# Patient Record
Sex: Male | Born: 1970 | Race: White | Hispanic: No | Marital: Married | Smoking: Never smoker
Health system: Southern US, Community
[De-identification: ages and names within clinical notes are randomized; demographics above are authoritative.]

## PROBLEM LIST (undated history)

## (undated) ENCOUNTER — Emergency Department (HOSPITAL_COMMUNITY): Payer: BLUE CROSS/BLUE SHIELD | Source: Home / Self Care

## (undated) DIAGNOSIS — C499 Malignant neoplasm of connective and soft tissue, unspecified: Secondary | ICD-10-CM

---

## 2006-08-23 ENCOUNTER — Encounter: Admission: RE | Admit: 2006-08-23 | Discharge: 2006-10-19 | Payer: Self-pay | Admitting: *Deleted

## 2013-09-12 ENCOUNTER — Encounter (INDEPENDENT_AMBULATORY_CARE_PROVIDER_SITE_OTHER): Payer: Self-pay | Admitting: Surgery

## 2013-09-18 ENCOUNTER — Encounter (INDEPENDENT_AMBULATORY_CARE_PROVIDER_SITE_OTHER): Payer: Self-pay | Admitting: Surgery

## 2013-09-18 ENCOUNTER — Ambulatory Visit (INDEPENDENT_AMBULATORY_CARE_PROVIDER_SITE_OTHER): Payer: BC Managed Care – PPO | Admitting: Surgery

## 2013-09-18 VITALS — BP 110/65 | HR 60 | Temp 96.9°F | Resp 12 | Ht 73.5 in | Wt 196.4 lb

## 2013-09-18 DIAGNOSIS — R229 Localized swelling, mass and lump, unspecified: Secondary | ICD-10-CM

## 2013-09-18 DIAGNOSIS — R2232 Localized swelling, mass and lump, left upper limb: Secondary | ICD-10-CM | POA: Insufficient documentation

## 2013-09-18 NOTE — Progress Notes (Signed)
Patient ID: Gene Nguyen, male   DOB: 1970-12-31, 43 y.o.   MRN: [id number]  Chief Complaint  Patient presents with  . Lipoma    HPI Gene Nguyen is a 43 y.o. male.   HPI This gentleman is referred to me by Baruch Goldmann PA for evaluation of a left shoulder mass. The patient reports that the mass is been present for approximately one year it is getting larger. He has a history of lipomas. There is a family history of cancer as well. He reports mild discomfort from the mass but it is getting larger quickly he reports. He has no difficulty moving his arm. He denies recent weight loss or weight gain History reviewed. No pertinent past medical history.  History reviewed. No pertinent past surgical history.  History reviewed. No pertinent family history.  Social History History  Substance Use Topics  . Smoking status: Never Smoker   . Smokeless tobacco: Not on file  . Alcohol Use: Yes     Comment: rare wine    No Known Allergies  No current outpatient prescriptions on file.   No current facility-administered medications for this visit.    Review of Systems Review of Systems  Constitutional: Negative for fever, chills and unexpected weight change.  HENT: Negative for congestion, hearing loss, sore throat, trouble swallowing and voice change.   Eyes: Negative for visual disturbance.  Respiratory: Negative for cough and wheezing.   Cardiovascular: Negative for chest pain, palpitations and leg swelling.  Gastrointestinal: Negative for nausea, vomiting, abdominal pain, diarrhea, constipation, blood in stool, abdominal distention, anal bleeding and rectal pain.  Genitourinary: Negative for hematuria and difficulty urinating.  Musculoskeletal: Negative for arthralgias.  Skin: Negative for rash and wound.  Neurological: Negative for seizures, syncope, weakness and headaches.  Hematological: Negative for adenopathy. Does not bruise/bleed easily.  Psychiatric/Behavioral:  Negative for confusion.    Blood pressure 110/65, pulse 60, temperature 96.9 F (36.1 C), resp. rate 12, height 6' 1.5" (1.867 m), weight 196 lb 6.4 oz (89.086 kg).  Physical Exam Physical Exam  Constitutional: He is oriented to person, place, and time. He appears well-developed and well-nourished. No distress.  HENT:  Head: Normocephalic and atraumatic.  Right Ear: External ear normal.  Left Ear: External ear normal.  Nose: Nose normal.  Mouth/Throat: Oropharynx is clear and moist. No oropharyngeal exudate.  Eyes: Conjunctivae are normal. Pupils are equal, round, and reactive to light. Right eye exhibits no discharge. Left eye exhibits no discharge. No scleral icterus.  Neck: Normal range of motion. Neck supple. No tracheal deviation present.  Cardiovascular: Normal rate, regular rhythm, normal heart sounds and intact distal pulses.   Pulmonary/Chest: Effort normal and breath sounds normal. No respiratory distress. He has no wheezes.  Musculoskeletal: Normal range of motion. He exhibits no edema and no tenderness.  There is a 6 cm x 4 cm mass in the left deltopectoral groove. It is firm but mobile with irregular borders. There is a small scar on the skin overlying the mass. It is not appreciably tender. The skin is slightly darkened around the area  Lymphadenopathy:    He has no cervical adenopathy.    He has no axillary adenopathy.  Neurological: He is alert and oriented to person, place, and time.  Skin: Skin is warm and dry. No erythema.  Psychiatric: His behavior is normal.    Data Reviewed   Assessment    Left shoulder mass     Plan    I am uncertain of the  etiology of this mass. This may represent a complex cyst versus lipoma as he has had lipomas in the past. The firmness, and irregular border is slightly worrisome along with the discoloration of the skin. I would strongly recommend removal of this mass for histologic evaluation to roll malignancy. I discussed the risks  of surgery which includes but is not limited to bleeding, infection, recurrence, need for further surgery, injury to surrounding structures, etc. He understands and wishes to proceed with surgery        Petra Dumler A 09/18/2013, 11:58 AM

## 2013-09-30 ENCOUNTER — Ambulatory Visit (INDEPENDENT_AMBULATORY_CARE_PROVIDER_SITE_OTHER): Payer: BC Managed Care – PPO | Admitting: Internal Medicine

## 2013-09-30 VITALS — BP 100/62 | HR 67 | Temp 98.5°F | Ht 73.5 in | Wt 198.0 lb

## 2013-09-30 DIAGNOSIS — T148XXA Other injury of unspecified body region, initial encounter: Secondary | ICD-10-CM

## 2013-09-30 DIAGNOSIS — R232 Flushing: Secondary | ICD-10-CM

## 2013-09-30 NOTE — Progress Notes (Signed)
   Subjective:    Patient ID: Gene Nguyen, male    DOB: Apr 17, 1970, 43 y.o.   MRN: [id number] This chart was scribed for Gene Lin, MD by Randa Evens, ED Scribe. This Patient was seen in room 11 and the patients care was started at 9:17 PM  HPI Gene Nguyen is a 43 y.o. male He presents to urgent medical with sudden onset of facial redness onset tonight while eating dinner. He states that he didn't take any antihist medication prior to arrival, but that while waiting in the waiting room his symptoms started to improve.  In retrospect, after researching Internet, He states that he believes the onset came from taking niacin, present in several different supplements he started taking today on the advice of the General Nutrition attendant.  He is actually relatively healthy  Review of Systems  Constitutional: Negative for fever and chills.   he complains of pain with pushoff or jumping in his left posterior thigh that has prevented him from being able to play basketball for the last several years. This started as a basketball injury. Objective:    Physical Exam  Nursing note and vitals reviewed. Constitutional: He is oriented to person, place, and time. He appears well-developed and well-nourished. No distress.  HENT:  Head: Normocephalic and atraumatic.  Eyes: Conjunctivae and EOM are normal.  Neck: Neck supple.  Cardiovascular: Normal rate.   Pulmonary/Chest: Effort normal. No respiratory distress.  Musculoskeletal: Normal range of motion.  There is tenderness deep in the left posterior thigh at the issue tuberosity  Neurological: He is alert and oriented to person, place, and time.  Skin: Skin is warm and dry. There is erythema.  Mild erythema of face and neck that appears to be resolving  Psychiatric: He has a normal mood and affect. His behavior is normal.    Assessment & Plan:  1-flushing secondary to niacin----discontinue supplements 2-muscle tear left  thigh--- refer  to physical therapy    I have completed the patient encounter in its entirety as documented by the scribe, with editing by me where necessary. Gene Nguyen, M.D.

## 2013-09-30 NOTE — Patient Instructions (Signed)
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

## 2013-11-04 DIAGNOSIS — C4910 Malignant neoplasm of connective and soft tissue of unspecified upper limb, including shoulder: Secondary | ICD-10-CM

## 2013-11-05 ENCOUNTER — Other Ambulatory Visit (INDEPENDENT_AMBULATORY_CARE_PROVIDER_SITE_OTHER): Payer: Self-pay

## 2013-11-05 MED ORDER — HYDROCODONE-ACETAMINOPHEN 5-325 MG PO TABS: 1.0000 | ORAL_TABLET | ORAL | Status: DC | PRN

## 2013-11-14 ENCOUNTER — Telehealth (INDEPENDENT_AMBULATORY_CARE_PROVIDER_SITE_OTHER): Payer: Self-pay

## 2013-11-14 NOTE — Telephone Encounter (Signed)
Dr. Clover Mealy assistant called back stating the path was sent out for second opinion. Pt advised it may be later next week before pt gets path result.

## 2013-11-14 NOTE — Telephone Encounter (Signed)
Pt called for path result. Per [place] path Dr Donato Heinz still working on path. Pt advised.

## 2013-11-18 ENCOUNTER — Ambulatory Visit (INDEPENDENT_AMBULATORY_CARE_PROVIDER_SITE_OTHER): Payer: BC Managed Care – PPO | Admitting: Surgery

## 2013-11-18 ENCOUNTER — Telehealth (INDEPENDENT_AMBULATORY_CARE_PROVIDER_SITE_OTHER): Payer: Self-pay

## 2013-11-18 VITALS — BP 110/60 | HR 61 | Temp 98.0°F | Ht 73.5 in | Wt 194.0 lb

## 2013-11-18 DIAGNOSIS — C4910 Malignant neoplasm of connective and soft tissue of unspecified upper limb, including shoulder: Secondary | ICD-10-CM

## 2013-11-18 DIAGNOSIS — Z09 Encounter for follow-up examination after completed treatment for conditions other than malignant neoplasm: Secondary | ICD-10-CM

## 2013-11-18 DIAGNOSIS — C4912 Malignant neoplasm of connective and soft tissue of left upper limb, including shoulder: Secondary | ICD-10-CM

## 2013-11-18 NOTE — Telephone Encounter (Signed)
Message copied by Illene Regulus on Mon Nov 18, 2013  3:34 PM ------      Message from: Joya San      Created: Mon Nov 18, 2013  2:33 PM      Contact: [phone]       Pt was going to be referred to [facility] but wife wants [facility] instead. tl ------

## 2013-11-18 NOTE — Addendum Note (Signed)
Addended by: Illene Regulus on: 11/18/2013 11:42 AM   Modules accepted: Orders

## 2013-11-18 NOTE — Telephone Encounter (Signed)
Called pt back to notify him that I would switch the referral order from [facility] to [place]. I did advise that Dr Ninfa Linden does not have a specific doctor to refer pt to as he did for [facility]. I will change the order to [facility] and call pt back wit the appt.

## 2013-11-18 NOTE — Progress Notes (Signed)
Subjective:     Patient ID: Gene Nguyen, male   DOB: 16-Sep-1970, 43 y.o.   MRN: [id number]  HPI He is here for his first postoperative visit status post removal of the large anterior left shoulder mass. He is doing very well has no complaints.  Review of Systems     Objective:   Physical Exam On exam, the incision is healed well. I removed the sutures. There is minimal underlying fluid and no evidence of infection  The pathology had to be sent to [facility] in [place] for a second opinion. 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 This was through a phone report from our pathologist. I do not know about the margins    Assessment:     Patient stable status post removal left shoulder mass     Plan:     I discussed the diagnosis with him. I believe he needs referral to [facility] to the orthopedic oncologist to determine whether further resection is necessary. I will see him back in 3 weeks. Currently, he may return to normal activity.

## 2013-11-19 ENCOUNTER — Other Ambulatory Visit (INDEPENDENT_AMBULATORY_CARE_PROVIDER_SITE_OTHER): Payer: Self-pay | Admitting: *Deleted

## 2013-11-19 DIAGNOSIS — C4911 Malignant neoplasm of connective and soft tissue of right upper limb, including shoulder: Secondary | ICD-10-CM

## 2013-11-19 NOTE — Telephone Encounter (Signed)
Pt called back and was given message.  He will wait to hear from our office with his appointment.

## 2013-11-20 ENCOUNTER — Other Ambulatory Visit (INDEPENDENT_AMBULATORY_CARE_PROVIDER_SITE_OTHER): Payer: Self-pay | Admitting: *Deleted

## 2013-11-20 DIAGNOSIS — C4911 Malignant neoplasm of connective and soft tissue of right upper limb, including shoulder: Secondary | ICD-10-CM

## 2013-11-25 ENCOUNTER — Ambulatory Visit
Admission: RE | Admit: 2013-11-25 | Discharge: 2013-11-25 | Disposition: A | Discharge status: Home / Self Care | Payer: BC Managed Care – PPO | Source: Ambulatory Visit | Attending: Surgery | Admitting: Surgery

## 2013-11-25 DIAGNOSIS — C4911 Malignant neoplasm of connective and soft tissue of right upper limb, including shoulder: Secondary | ICD-10-CM

## 2013-11-25 MED ORDER — GADOBENATE DIMEGLUMINE 529 MG/ML IV SOLN
16.0000 mL | Freq: Once | INTRAVENOUS | Status: AC | PRN
  Administered 2013-11-25: 16 mL via INTRAVENOUS

## 2014-07-10 ENCOUNTER — Encounter (HOSPITAL_COMMUNITY): Payer: Self-pay

## 2014-07-10 ENCOUNTER — Emergency Department (HOSPITAL_COMMUNITY)
Admission: EM | Admit: 2014-07-10 | Discharge: 2014-07-10 | Disposition: A | Discharge status: Home / Self Care | Payer: BLUE CROSS/BLUE SHIELD | Attending: Emergency Medicine | Admitting: Emergency Medicine

## 2014-07-10 DIAGNOSIS — Z79899 Other long term (current) drug therapy: Secondary | ICD-10-CM | POA: Diagnosis not present

## 2014-07-10 DIAGNOSIS — J029 Acute pharyngitis, unspecified: Secondary | ICD-10-CM | POA: Diagnosis not present

## 2014-07-10 DIAGNOSIS — R0981 Nasal congestion: Secondary | ICD-10-CM

## 2014-07-10 LAB — RAPID STREP SCREEN (MED CTR MEBANE ONLY): Streptococcus, Group A Screen (Direct): NEGATIVE

## 2014-07-10 MED ORDER — LORATADINE 10 MG PO TABS: 10.0000 mg | ORAL_TABLET | Freq: Every day | ORAL | Status: DC

## 2014-07-10 NOTE — ED Notes (Addendum)
Patient reports that he has had a sore throat for nearly 3 weeks.  Productive cough.  He reports his wife and child have had strep throat recently.  States, "I think I have bronchitis or walking pneumonia."

## 2014-07-10 NOTE — ED Provider Notes (Signed)
CSN: [id number]     Arrival date & time 07/10/14  0433 History   First MD Initiated Contact with Patient 07/10/14 [phone]     Chief Complaint  Patient presents with  . Sore Throat     (Consider location/radiation/quality/duration/timing/severity/associated sxs/prior Treatment) Patient is a 44 y.o. male presenting with pharyngitis. The history is provided by the patient.  Sore Throat This is a new problem. The current episode started 1 to 4 weeks ago. The problem occurs constantly. The problem has been unchanged. Associated symptoms include a rash and a sore throat. Pertinent negatives include no chills, congestion, coughing, fever, myalgias, nausea, neck pain or weakness. The symptoms are aggravated by swallowing. He has tried nothing for the symptoms. The treatment provided no relief.    History reviewed. No pertinent past medical history. History reviewed. No pertinent past surgical history. Family History  Problem Relation Age of Onset  . Cancer Other    History  Substance Use Topics  . Smoking status: Never Smoker   . Smokeless tobacco: Not on file  . Alcohol Use: Yes     Comment: rare wine    Review of Systems  Constitutional: Negative for fever and chills.  HENT: Positive for postnasal drip, rhinorrhea and sore throat. Negative for congestion.   Respiratory: Negative for cough and shortness of breath.   Gastrointestinal: Negative for nausea.  Musculoskeletal: Negative for myalgias and neck pain.  Skin: Positive for rash.  Neurological: Negative for weakness.      Allergies  Review of patient's allergies indicates no known allergies.  Home Medications   Prior to Admission medications   Medication Sig Start Date End Date Taking? Authorizing Provider  OVER THE COUNTER MEDICATION Take 1 capsule by mouth daily.   Yes Historical Provider, MD  loratadine (CLARITIN) 10 MG tablet Take 1 tablet (10 mg total) by mouth daily. 07/10/14   Junius Creamer, NP   BP 121/71 mmHg  Pulse  80  Temp(Src) 98.3 F (36.8 C) (Oral)  Resp 20  SpO2 99% Physical Exam  Constitutional: He is oriented to person, place, and time. He appears well-developed and well-nourished.  HENT:  Head: Normocephalic.  Right Ear: External ear normal.  Left Ear: External ear normal.  Mouth/Throat: Uvula is midline. No trismus in the jaw. No uvula swelling. Posterior oropharyngeal erythema present. No oropharyngeal exudate or posterior oropharyngeal edema.  Posterior pharynx is reddened, not edematous.  No exudate  Eyes: Pupils are equal, round, and reactive to light.  Neck: Normal range of motion.  Cardiovascular: Normal rate and regular rhythm.   Pulmonary/Chest: Effort normal and breath sounds normal.  Musculoskeletal: Normal range of motion.  Lymphadenopathy:    He has no cervical adenopathy.  Neurological: He is alert and oriented to person, place, and time.  Skin: Skin is warm and dry.    ED Course  Procedures (including critical care time) Labs Review Labs Reviewed  RAPID STREP SCREEN  CULTURE, GROUP A STREP    Imaging Review No results found.   EKG Interpretation None      MDM   Final diagnoses:  Pharyngitis  Nasal congestion         Junius Creamer, NP 07/10/14 0559  Linton Flemings, MD 07/10/14 [id number]

## 2014-07-10 NOTE — Discharge Instructions (Signed)
Your strep test is negative.  BUS prescription for Claritin.  Please take this on a regular basis.  I think most of your symptoms are due to seasonal allergies

## 2014-07-12 LAB — CULTURE, GROUP A STREP: Strep A Culture: NEGATIVE

## 2014-07-13 ENCOUNTER — Encounter (HOSPITAL_COMMUNITY): Payer: Self-pay | Admitting: Emergency Medicine

## 2014-07-13 ENCOUNTER — Emergency Department (HOSPITAL_COMMUNITY): Payer: BLUE CROSS/BLUE SHIELD

## 2014-07-13 ENCOUNTER — Emergency Department (HOSPITAL_COMMUNITY)
Admission: EM | Admit: 2014-07-13 | Discharge: 2014-07-13 | Disposition: A | Discharge status: Home / Self Care | Payer: BLUE CROSS/BLUE SHIELD | Attending: Emergency Medicine | Admitting: Emergency Medicine

## 2014-07-13 DIAGNOSIS — Z85830 Personal history of malignant neoplasm of bone: Secondary | ICD-10-CM | POA: Diagnosis not present

## 2014-07-13 DIAGNOSIS — Y9389 Activity, other specified: Secondary | ICD-10-CM | POA: Diagnosis not present

## 2014-07-13 DIAGNOSIS — S8991XA Unspecified injury of right lower leg, initial encounter: Secondary | ICD-10-CM | POA: Diagnosis present

## 2014-07-13 DIAGNOSIS — Y9289 Other specified places as the place of occurrence of the external cause: Secondary | ICD-10-CM | POA: Diagnosis not present

## 2014-07-13 DIAGNOSIS — X58XXXA Exposure to other specified factors, initial encounter: Secondary | ICD-10-CM | POA: Insufficient documentation

## 2014-07-13 DIAGNOSIS — S76101A Unspecified injury of right quadriceps muscle, fascia and tendon, initial encounter: Secondary | ICD-10-CM | POA: Insufficient documentation

## 2014-07-13 DIAGNOSIS — M25561 Pain in right knee: Secondary | ICD-10-CM

## 2014-07-13 DIAGNOSIS — Y998 Other external cause status: Secondary | ICD-10-CM | POA: Insufficient documentation

## 2014-07-13 DIAGNOSIS — Z79899 Other long term (current) drug therapy: Secondary | ICD-10-CM | POA: Diagnosis not present

## 2014-07-13 HISTORY — DX: Malignant neoplasm of connective and soft tissue, unspecified: C49.9

## 2014-07-13 MED ORDER — KETOROLAC TROMETHAMINE 60 MG/2ML IM SOLN
60.0000 mg | Freq: Once | INTRAMUSCULAR | Status: AC
  Administered 2014-07-13: 60 mg via INTRAMUSCULAR
  Filled 2014-07-13: qty 2

## 2014-07-13 NOTE — Discharge Instructions (Signed)
Return to the emergency room with worsening of symptoms, new symptoms or with symptoms that are concerning, especially fevers, redness, swelling, numbness, tingling, weakness. RICE: Rest, Ice (three cycles of 20 mins on, 87mins off at least twice a day), compression/brace, elevation. Heating pad works well for back pain. Ibuprofen 400mg  (2 tablets 200mg ) every 5-6 hours for 3-5 days. Keep right knee immobilizer on at all times. Do not bear weight on right foot until evaluated by orthopedics. Call the number above as soon as possible to schedule an appointment. Read below information and follow recommendations. Quadriceps Tendon Tear/Disruption with Rehab The quadriceps muscles are located on the front of the thigh and are responsible for straightening the knee and bending the hip. The quadriceps tendon connects these muscles to the kneecap (patella) and also from the patella to a portion of the shin bone (tibial tubercle). A quadriceps tendon tear or disruption is characterized by a partial or complete tear of the quadriceps tendon between the quadriceps muscles and the patella. Quadriceps tendon tears or disruptions often cause pain above the knee and result in a decrease in function of the quadriceps muscles.  SYMPTOMS   A "pop" or tear felt or heard above the patella at the time of injury.  Pain, tenderness, inflammation, and/or bruising over the quadriceps tendon.  Pain that worsens with use of the quadriceps muscles.  Difficulty with common tasks that involve the quadriceps muscle, such as walking.  A crackling sound (crepitation) when the tendon is moved or touched.  Loss of fullness of the muscle or bulging within the area of muscle with complete rupture. CAUSES  A strain occurs when a force is placed on the muscle or tendon that is greater than it can withstand. Common mechanisms of injury include:  Repetitive strenuous use of the quadriceps muscles. This may be due to an increase in  the intensity, frequency, or duration of exercise.  Direct trauma to the quadriceps muscles or tendons. RISK INCREASES WITH:  Activities that involve forceful contractions of the quadriceps muscles (jumping or sprinting).  Contact sports (soccer or football).  Poor strength and flexibility.  Failure to warm-up properly before activity.  Previous injury to the thigh or knee.  Untreated quadriceps tendinitis.  Corticosteroid injections into the quadriceps tendon. PREVENTION  Warm up and stretch properly before activity.  Allow for adequate recovery between workouts.  Maintain physical fitness:  Strength, flexibility, and endurance.  Cardiovascular fitness.  Wear properly fitted and padded protective equipment. PROGNOSIS  If treated properly, then recovery from a quadriceps tendon tear or disruption usually occurs; however the recovery period may b 6 to 9 months.  RELATED COMPLICATIONS   Quadriceps muscle weakness.  Re-rupture of the tendon after treatment.  Prolonged healing time, if improperly treated or re-injured.  Risks of surgery: infection, bleeding, nerve damage, or damage to surrounding tissues. TREATMENT  Initial treatment involves rest from any activities that aggravate the symptoms. Ice, medication, and elevation may be used to help reduce pain and inflammation. The use of strengthening and stretching exercises may help reduce pain with activity. These exercises may be performed at home or with referral to a therapist. If the tear is complete, then surgery is usually required to repair the tendon, as it cannot heal on its own. After surgery immobilization is required to allow for healing. After immobilization it is important to perform strengthening and stretching exercises to help regain strength and a full range of motion.  MEDICATION   If pain medication is necessary,  then nonsteroidal anti-inflammatory medications, such as aspirin and ibuprofen, or other  minor pain relievers, such as acetaminophen, are often recommended.  Do not take pain medication for 7 days before surgery.  Prescription pain relievers may be given if deemed necessary by your caregiver. Use only as directed and only as much as you need. COLD THERAPY  Cold treatment (icing) relieves pain and reduces inflammation. Cold treatment should be applied for 10 to 15 minutes every 2 to 3 hours for inflammation and pain and immediately after any activity that aggravates your symptoms. Use ice packs or massage the area with a piece of ice (ice massage). SEEK MEDICAL CARE IF:  Treatment seems to offer no benefit, or the condition worsens.  Any medications produce adverse side effects.  Any complications from surgery occur:  Pain, numbness, or coldness in the extremity operated upon.  Discoloration of the nail beds (they become blue or gray) of the extremity operated upon.  Signs of infections (fever, pain, inflammation, redness, or persistent bleeding). EXERCISES  Document Released: 03/21/2005 Document Revised: 06/13/2011 Document Reviewed: 07/03/2008 [facility] Patient Information 2015 [employer], [place]. This information is not intended to replace advice given to you by your health care provider. Make sure you discuss any questions you have with your health care provider.

## 2014-07-13 NOTE — ED Provider Notes (Signed)
CSN: [id number]     Arrival date & time 07/13/14  49 History   First MD Initiated Contact with Patient 07/13/14 1113     Chief Complaint  Patient presents with  . Knee Injury     (Consider location/radiation/quality/duration/timing/severity/associated sxs/prior Treatment) HPI  Gene Nguyen is a 44 y.o. male with PMH of sarcoma presenting with right knee pain and reported swelling for 1 week after more arts training. Patient reports continuing to train on it with persistent pain and worsening swelling. Patient has used ibuprofen and ice with some relief but states he is unable to bear weight on it and is ambulating with a cane. Patient denies alleviating factors. No fevers chills no numbness, dealing, weakness.   Past Medical History  Diagnosis Date  . Sarcoma    History reviewed. No pertinent past surgical history. Family History  Problem Relation Age of Onset  . Cancer Other    History  Substance Use Topics  . Smoking status: Never Smoker   . Smokeless tobacco: Not on file  . Alcohol Use: Yes     Comment: rare wine    Review of Systems  Musculoskeletal: Positive for myalgias. Negative for joint swelling.  Skin: Negative for color change and wound.  Neurological: Negative for weakness and numbness.      Allergies  Review of patient's allergies indicates no known allergies.  Home Medications   Prior to Admission medications   Medication Sig Start Date End Date Taking? Authorizing Provider  loratadine (CLARITIN) 10 MG tablet Take 1 tablet (10 mg total) by mouth daily. 07/10/14   Junius Creamer, NP  OVER THE COUNTER MEDICATION Take 1 capsule by mouth daily.    Historical Provider, MD   BP 118/67 mmHg  Pulse 87  Temp(Src) 99.1 F (37.3 C) (Oral)  Resp 20  SpO2 100% Physical Exam  Constitutional: He appears well-developed and well-nourished. No distress.  HENT:  Head: Normocephalic and atraumatic.  Eyes: Conjunctivae are normal. Right eye exhibits no  discharge. Left eye exhibits no discharge.  Pulmonary/Chest: Effort normal. No respiratory distress.  Musculoskeletal:  Right knee: No effusion, erythema or warmth. No tenderness over medial joint line. Mild tenderness over lateral joint line. Tenderness and muscle tightness behind medial no bursa. Full range of motion of right knee without crepitus. No ligamentous laxity noted. Negative anterior and posterior drawer test. Patient with antalgic gait with cane. No tenderness to lower leg or thigh. No lesions, ecchymoses or wounds to thigh or leg. No swelling of lower extremities. No calf Tenderness. 2+ pedal pulses equal bilaterally.  Intact strength and sensation. Patient with low riding patella on right. Quadriceps tendon appears to be partially intact.   Neurological: He is alert. Coordination normal.  Skin: He is not diaphoretic.  Psychiatric: He has a normal mood and affect. His behavior is normal.  Nursing note and vitals reviewed.   ED Course  Procedures (including critical care time) Labs Review Labs Reviewed - No data to display  Imaging Review Dg Knee Complete 4 Views Right  07/13/2014   CLINICAL DATA:  Trauma to right knee six days ago with worsening pain and trouble bearing weight. Pain posteriorly.  EXAM: RIGHT KNEE - COMPLETE 4+ VIEW  COMPARISON:  None.  FINDINGS: Exam demonstrates no evidence of fracture or dislocation. Suggestion of a small joint effusion. Slight patella baja as cannot exclude quadriceps tendon injury.  IMPRESSION: No acute fracture. Possible small joint effusion. Slight patella baja as cannot exclude a quadriceps tendon injury.  Electronically Signed   By: Marin Olp M.D.   On: 07/13/2014 12:56     EKG Interpretation None      MDM   Final diagnoses:  Unsp injury of right quadriceps musc/fasc/tend, init  Knee pain, right   Patient with patella baja with possible quadriceps tendon injury. Neurovascular intact. Full range of motion. Septic  arthritis. Patient's pain managed in the ED. Discussed further ibuprofen and rice use. Patient given knee immobilizer and instructed nonweightbearing status. Patient to follow-up with orthopedics  Discussed return precautions with patient. Discussed all results and patient verbalizes understanding and agrees with plan.  I personally performed the services described in this documentation, which was scribed in my presence. The recorded information has been reviewed and is accurate.   Al Corpus, PA-C 07/13/14 [place], MD 07/13/14 1438

## 2014-07-13 NOTE — ED Notes (Signed)
Pt c/o R knee pain and swelling since Monday night after martial arts training. Pt. sts he continued to train on it and the pain and swelling has gotten worse. Pt sts he is unable to bear weight on it without a cane. Pt has been taking ibuprofen without relief. A&Ox4.

## 2014-07-13 NOTE — ED Notes (Signed)
Ortho at bedside.

## 2014-07-18 ENCOUNTER — Other Ambulatory Visit: Payer: Self-pay | Admitting: Orthopaedic Surgery

## 2014-07-18 DIAGNOSIS — M25562 Pain in left knee: Secondary | ICD-10-CM

## 2014-07-21 ENCOUNTER — Ambulatory Visit
Admission: RE | Admit: 2014-07-21 | Discharge: 2014-07-21 | Disposition: A | Discharge status: Home / Self Care | Payer: BLUE CROSS/BLUE SHIELD | Source: Ambulatory Visit | Attending: Orthopaedic Surgery | Admitting: Orthopaedic Surgery

## 2014-07-21 DIAGNOSIS — M25562 Pain in left knee: Secondary | ICD-10-CM

## 2014-08-06 ENCOUNTER — Ambulatory Visit (INDEPENDENT_AMBULATORY_CARE_PROVIDER_SITE_OTHER): Payer: BLUE CROSS/BLUE SHIELD | Admitting: Urgent Care

## 2014-08-06 ENCOUNTER — Other Ambulatory Visit: Payer: Self-pay | Admitting: Urgent Care

## 2014-08-06 ENCOUNTER — Ambulatory Visit (INDEPENDENT_AMBULATORY_CARE_PROVIDER_SITE_OTHER): Payer: BLUE CROSS/BLUE SHIELD

## 2014-08-06 VITALS — BP 128/82 | HR 73 | Temp 98.4°F | Resp 16 | Ht 73.5 in | Wt 178.0 lb

## 2014-08-06 DIAGNOSIS — M25562 Pain in left knee: Secondary | ICD-10-CM

## 2014-08-06 DIAGNOSIS — M25462 Effusion, left knee: Secondary | ICD-10-CM

## 2014-08-06 NOTE — Patient Instructions (Addendum)
Knee Effusion The medical term for having fluid in your knee is effusion. This is often due to an internal derangement of the knee. This means something is wrong inside the knee. Some of the causes of fluid in the knee may be torn cartilage, a torn ligament, or bleeding into the joint from an injury. Your knee is likely more difficult to bend and move. This is often because there is increased pain and pressure in the joint. The time it takes for recovery from a knee effusion depends on different factors, including:   Type of injury.  Your age.  Physical and medical conditions.  Rehabilitation Strategies. How long you will be away from your normal activities will depend on what kind of knee problem you have and how much damage is present. Your knee has two types of cartilage. Articular cartilage covers the bone ends and lets your knee bend and move smoothly. Two menisci, thick pads of cartilage that form a rim inside the joint, help absorb shock and stabilize your knee. Ligaments bind the bones together and support your knee joint. Muscles move the joint, help support your knee, and take stress off the joint itself. CAUSES  Often an effusion in the knee is caused by an injury to one of the menisci. This is often a tear in the cartilage. Recovery after a meniscus injury depends on how much meniscus is damaged and whether you have damaged other knee tissue. Small tears may heal on their own with conservative treatment. Conservative means rest, limited weight bearing activity and muscle strengthening exercises. Your recovery may take up to 6 weeks.  TREATMENT  Larger tears may require surgery. Meniscus injuries may be treated during arthroscopy. Arthroscopy is a procedure in which your surgeon uses a small telescope like instrument to look in your knee. Your caregiver can make a more accurate diagnosis (learning what is wrong) by performing an arthroscopic procedure. If your injury is on the inner margin  of the meniscus, your surgeon may trim the meniscus back to a smooth rim. In other cases your surgeon will try to repair a damaged meniscus with stitches (sutures). This may make rehabilitation take longer, but may provide better long term result by helping your knee keep its shock absorption capabilities. Ligaments which are completely torn usually require surgery for repair. HOME CARE INSTRUCTIONS  Use crutches as instructed.  If a brace is applied, use as directed.  Once you are home, an ice pack applied to your swollen knee may help with discomfort and help decrease swelling.  Keep your knee raised (elevated) when you are not up and around or on crutches.  Only take over-the-counter or prescription medicines for pain, discomfort, or fever as directed by your caregiver.  Your caregivers will help with instructions for rehabilitation of your knee. This often includes strengthening exercises.  You may resume a normal diet and activities as directed. SEEK MEDICAL CARE IF:   There is increased swelling in your knee.  You notice redness, swelling, or increasing pain in your knee.  An unexplained oral temperature above 102 F (38.9 C) develops. SEEK IMMEDIATE MEDICAL CARE IF:   You develop a rash.  You have difficulty breathing.  You have any allergic reactions from medications you may have been given.  There is severe pain with any motion of the knee. MAKE SURE YOU:   Understand these instructions.  Will watch your condition.  Will get help right away if you are not doing well or get worse.   Document Released: 06/11/2003 Document Revised: 06/13/2011 Document Reviewed: 08/15/2007 [facility] Patient Information 2015 [place], [place]. This information is not intended to replace advice given to you by your health care provider. Make sure you discuss any questions you have with your health care provider.   Elastic Bandage and RICE Elastic bandages come in different shapes and  sizes. They perform different functions. Your caregiver will help you to decide what is best for your protection, recovery, or rehabilitation following an injury. The following are some general tips to help you use an elastic bandage.  Use the bandage as directed by the maker of the bandage you are using.  Do not wrap it too tight. This may cut off the circulation of the arm or leg below the bandage.  If part of your body beyond the bandage becomes blue, numb, or swollen, it is too tight. Loosen the bandage as needed to prevent these problems.  See your caregiver or trainer if the bandage seems to be making your problems worse rather than better. Bandages may be a reminder to you that you have an injury. However, they provide very little support. The few pounds of support they provide are minor considering the pressure it takes to injure a joint or tear ligaments. Therefore, the joint will not be able to handle all of the wear and tear it could before the injury. The routine care of many injuries includes Rest, Ice, Compression, and Elevation (RICE).  Rest is required to allow your body to heal. Generally, routine activities can be resumed when comfortable. Injured tendons and bones take about 6 weeks to heal.  Icing the injury helps keep the swelling down and reduces pain. Do not apply ice directly to the skin. Put ice in a plastic bag. Place a towel between the skin and the bag. This will prevent frostbite to the skin. Apply ice bags to the injured area for 15-20 minutes, every 2 hours while awake. Do this for the first 24 to 48 hours, then as directed by your caregiver.  Compression helps keep swelling down, gives support, and helps with discomfort. If an elastic bandage has been applied today, it should be removed and reapplied every 3 to 4 hours. It should not be applied tightly, but firmly enough to keep swelling down. Watch fingers or toes for swelling, bluish discoloration, coldness,  numbness, or increased pain. If any of these problems occur, remove the bandage and reapply it more loosely. If these problems persist, contact your caregiver.  Elevation helps reduce swelling and decreases pain. The injured area (arms, hands, legs, or feet) should be placed near to or above the heart (center of the chest) if able. Persistent pain and inability to use the injured area for more than 2 to 3 days are warning signs. You should see a caregiver for a follow-up visit as soon as possible. Initially, a minor broken bone (hairline fracture) may not be seen on X-rays. It may take 7 to 10 days to finally show up. Continued pain and swelling show that further evaluation and/or X-rays are needed. Make a follow-up visit with your caregiver. A specialist in reading X-rays (radiologist) will read your X-rays again. Finding out the results of your test Not all test results are available during your visit. If your test results are not back during the visit, make an appointment with your caregiver to find out the results. Do not assume everything is normal if you have not heard from your caregiver or the medical facility.  It is important for you to follow up on all of your test results. Document Released: 09/10/2001 Document Revised: 06/13/2011 Document Reviewed: 07/23/2007 [facility] Patient Information 2015 [place], [place]. This information is not intended to replace advice given to you by your health care provider. Make sure you discuss any questions you have with your health care provider.

## 2014-08-06 NOTE — Progress Notes (Signed)
    MRN: [id number] DOB: May 02, 1970  Subjective:   Gene Nguyen is a 44 y.o. male presenting for chief complaint of Knee Pain  Reports ~1 week history of progressively worsening left knee swelling and associated knee pain. Takes Tylenol and ibuprofen with significant knee pain relief. Admits that he has had drainage and steroid injection in his right knee. Patient states that he does favor his right knee, has had increased activity for the past couple of weeks including exercise (lunges) and more work at home. Also had recent (~1.5 weeks ago) right knee aspiration for effusion and baker's cyst. Patient has scheduled an appointment with Dr. Ninfa Linden at [facility] on 25th of May 2016. 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 Denies fevers, redness, trauma or injury. Denies any other aggravating or relieving factors, no other questions or concerns.  Gene Nguyen has a current medication list which includes the following prescription(s): OVER THE COUNTER MEDICATION. She is allergic to hydrocodone and latex.  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 Also  has no past surgical history on file.  ROS As in subjective.  Objective:   Vitals: BP 128/82 mmHg  Pulse 73  Temp(Src) 98.4 F (36.9 C) (Oral)  Resp 16  Ht 6' 1.5" (1.867 m)  Wt 178 lb (80.74 kg)  BMI 23.16 kg/m2  SpO2 97%  Physical Exam  Constitutional: He is oriented to person, place, and time. He appears well-developed and well-nourished.  Cardiovascular: Normal rate.   Pulmonary/Chest: Effort normal.  Musculoskeletal:       Left knee: He exhibits decreased range of motion (flexion), swelling and effusion. He exhibits no ecchymosis, no deformity, no laceration, no erythema, normal alignment, no LCL laxity, normal patellar mobility and no bony tenderness. No tenderness found.  Neurological: He is alert and oriented to person, place, and time.  Skin: Skin is warm and dry. No rash noted. No  erythema. No pallor.   PROCEDURE: Left knee aspiration Verbal consent obtained. Site prepped with Betadine swabs, ~1cc of 2% lidocaine plain used for local anaesthesia. ~30cc of cloudy yellow fluid aspirated. Cleansed and dressed.  Assessment and Plan :   1. Knee swelling, left 2. Left knee pain 3. Knee effusion, left - Labs pending, advised patient to continue NSAID therapy, f/u with ortho, reviewed RICE method of knee care for patient  Jaynee Eagles, PA-C [facility] [place] [facility] [phone] 08/06/2014 3:04 PM

## 2014-08-07 LAB — SYNOVIAL CELL COUNT + DIFF, W/ CRYSTALS
CRYSTALS FLUID: NONE SEEN
Eosinophils-Synovial: 0 % (ref 0–1)
Lymphocytes-Synovial Fld: 8 % (ref 0–20)
Monocyte/Macrophage: 24 % — ABNORMAL LOW (ref 50–90)
Neutrophil, Synovial: 68 % — ABNORMAL HIGH (ref 0–25)
WBC, SYNOVIAL: 3260 uL — AB (ref 0–200)

## 2014-08-10 LAB — BODY FLUID CULTURE
GRAM STAIN: NONE SEEN
GRAM STAIN: NONE SEEN
Organism ID, Bacteria: NO GROWTH

## 2014-08-13 ENCOUNTER — Telehealth: Payer: Self-pay | Admitting: Urgent Care

## 2014-08-13 NOTE — Telephone Encounter (Signed)
Discussed results which are not diagnostic. Patient's left knee is still swollen but not more than after we performed aspiration in clinic on 08/06/2014. Still has knee pain which is somewhat controlled with ibuprofen. Offered patient further work-up but he is scheduled to see orthopedist on 08/18/2014. Suggested patient follow up with them and if necessary return to clinic for more blood work and possible referral to rheumatology.

## 2014-08-13 NOTE — Telephone Encounter (Signed)
-----   Message from Darlyne Russian, MD sent at 08/12/2014  7:58 AM EDT ----- He definitely has an inflammatory reaction his knee. Culture was negative at 3 days. 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 If he wants to come here first we can do blood testing to include sedimentation rate CBC rheumatoid factor and a Ace level and hep C level. As well as Lyme titers.

## 2014-08-13 NOTE — Telephone Encounter (Signed)
Called patient to discuss lab results and plan for f/u. Unable to leave VM due to mailbox being full. Please try patient again and ask for a reliable time for me to reach him. Thank you!

## 2014-08-14 NOTE — Telephone Encounter (Signed)
That is fine. He definitely needs to follow-up because of needing to get a diagnosis

## 2015-04-21 ENCOUNTER — Ambulatory Visit (INDEPENDENT_AMBULATORY_CARE_PROVIDER_SITE_OTHER): Payer: BLUE CROSS/BLUE SHIELD | Admitting: Family Medicine

## 2015-04-21 ENCOUNTER — Ambulatory Visit (INDEPENDENT_AMBULATORY_CARE_PROVIDER_SITE_OTHER): Payer: BLUE CROSS/BLUE SHIELD

## 2015-04-21 VITALS — BP 116/68 | HR 62 | Temp 97.9°F | Resp 16 | Ht 73.0 in | Wt 192.0 lb

## 2015-04-21 DIAGNOSIS — M7989 Other specified soft tissue disorders: Secondary | ICD-10-CM

## 2015-04-21 NOTE — Patient Instructions (Signed)
Rest your hand for 1 week (no punching).  If your pain/swelling is completely gone then you may try easy punching with gloves. Any pain with that and you should follow up.  If you have persistent pain next week then please follow up for a possible repeat XR. No medications are needed. You may consider icing the area if you have pain. Please call with any questions.

## 2015-04-21 NOTE — Progress Notes (Signed)
[facility] [address] [place] [address] [phone]  Date:  04/21/2015   Name:  Bryten Shurts   DOB:  Jan 17, 1971   MRN:  [id number]  PCP:  No PCP Per Patient    Chief Complaint: Hand Injury   History of Present Illness:  Dearion Yohman is a 45 y.o. very pleasant male patient who presents with the following: Right hand swelling following MMA class last night.  - Punching bag bare knuckle.   - He felt like his tendon snapped over his - He has minimal to no pain today. - He noticed swelling and erythema initially, but this increased overnight. - He is not taking any pain medications as he is not in pain. He has been icing off and on. - No wound. - Otherwise he feels well.   Patient Active Problem List   Diagnosis Date Noted  . Mass of skin of left shoulder 09/18/2013    Past Medical History  Diagnosis Date  . Sarcoma [facility])     History reviewed. No pertinent past surgical history.  Social History  Substance Use Topics  . Smoking status: Never Smoker   . Smokeless tobacco: None  . Alcohol Use: Yes     Comment: rare wine    Family History  Problem Relation Age of Onset  . Cancer Other     Allergies  Allergen Reactions  . Hydrocodone Nausea Only  . Latex Dermatitis    Medication list has been reviewed and updated.  Current Outpatient Prescriptions on File Prior to Visit  Medication Sig Dispense Refill  . acetaminophen (TYLENOL) 650 MG CR tablet Take by mouth. Reported on 04/21/2015    . ibuprofen (ADVIL,MOTRIN) 800 MG tablet Take by mouth. Reported on 04/21/2015    . OVER THE COUNTER MEDICATION Take 1 capsule by mouth daily. Reported on 04/21/2015     No current facility-administered medications on file prior to visit.    Review of Systems:  As listed above in HPI.   Physical Examination: Filed Vitals:   04/21/15 2002  BP: 116/68  Pulse: 62  Temp: 97.9 F (36.6 C)  Resp: 16   Filed Vitals:   04/21/15 2002   Height: 6\' 1"  (1.854 m)  Weight: 192 lb (87.091 kg)   Body mass index is 25.34 kg/(m^2). Ideal Body Weight: Weight in (lb) to have BMI = 25: 189.1 Calm, NAD Non-labored breathing  Wrist/HAND: Inspection abnormal with erythema extending from the 5th MCP to the 2nd.  Slightly indentation between the 4th/5th, but no interruption of the sking.  No joint swelling identified.  ROM smooth and normal with good flexion and extension of the wrist, MCPs, and interphalangeals. Ulnar/radial deviation that is symmetrical with opposite wrist. Flexion of the fingers appears symmetric to the contralateral side. Palpation is normal over navicular, lunate, and TFCC; tendons without tenderness/ swelling.  Is tender at the base of the 4th/5th PIP.  Strength 5/5 in all directions without pain. Grip and intrinsic hand muscle strength 5/5.  No pain with any movement.  Extensor and flexor (superficial & deep) fully functional in each digit.  [facility] reading (PRIMARY) by  Dr. Jimmye Norman. Complete right hand x-ray reviewed without any fracture identified, including the region of the fourth and fifth MCPs where he is most tender.   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 He is without pain, weakness, loss of function.  His only slightly tender over the interdigital space between the fourth and fifth MTPs. We discussed the possibility of a nondisplacedfracture and that he should take the next week off from  boxing. If he has no pain/swelling next week then he may slowly attempt to return to boxing. If he isn't any pain whatsoever then he should return to the clinic and we will reimage him. He should call with any questions in the interim.  Signed Gerre Pebbles, MD

## 2015-04-23 NOTE — Progress Notes (Signed)
Xray read and patient discussed/examined with Dr. Jimmye Norman. Agree with assessment and plan of care per his note.

## 2015-05-23 ENCOUNTER — Ambulatory Visit (INDEPENDENT_AMBULATORY_CARE_PROVIDER_SITE_OTHER): Payer: BLUE CROSS/BLUE SHIELD

## 2015-05-23 ENCOUNTER — Ambulatory Visit (INDEPENDENT_AMBULATORY_CARE_PROVIDER_SITE_OTHER): Payer: BLUE CROSS/BLUE SHIELD | Admitting: Internal Medicine

## 2015-05-23 VITALS — BP 100/64 | HR 60 | Temp 98.7°F | Resp 16 | Ht 73.0 in | Wt 190.0 lb

## 2015-05-23 DIAGNOSIS — M79641 Pain in right hand: Secondary | ICD-10-CM | POA: Diagnosis not present

## 2015-05-23 DIAGNOSIS — S60221D Contusion of right hand, subsequent encounter: Secondary | ICD-10-CM | POA: Diagnosis not present

## 2015-05-23 MED ORDER — DICLOFENAC SODIUM 3 % TD GEL: 1.0000 "application " | Freq: Two times a day (BID) | TRANSDERMAL | Status: DC

## 2015-05-23 NOTE — Progress Notes (Signed)
   Subjective:    Patient ID: Gene Nguyen, male    DOB: [birth year], 45 y.o.   MRN: [id number] By signing my name below, I, Zola Button, attest that this documentation has been prepared under the direction and in the presence of Tami Lin, MD.  Electronically Signed: Zola Button, Medical Scribe. 05/23/2015. 10:55 AM.  HPI HPI Comments: Gene Nguyen is a 45 y.o. male who presents to the [facility] for a follow-up for a right hand injury that occurred about a month ago. Patient was seen here on 1/17 by Dr. Jimmye Norman, resident, for right hand swelling that started after MMA class the night prior. The initial XR was negative. He notes that the swelling has resolved, but he still has pain to the hand. He did rest his hand for a few weeks, but he did use his hand once this past week at [facility] class. Patient notes he was able to use the hand without any problems with gloves on.   Review of Systems     Objective:   Physical Exam  Constitutional: He is oriented to person, place, and time. He appears well-developed and well-nourished. No distress.  HENT:  Head: Normocephalic and atraumatic.  Mouth/Throat: Oropharynx is clear and moist. No oropharyngeal exudate.  Eyes: Pupils are equal, round, and reactive to light.  Neck: Neck supple.  Cardiovascular: Normal rate.   Pulmonary/Chest: Effort normal.  Musculoskeletal: He exhibits tenderness. He exhibits no edema.  Right hand has complete ROM with flexion and extension of the 4th and 5th fingers against resistance without pain. However, he is very TTP along the joint lines of the MCPs in the 4th, 5th web space.  Neurological: He is alert and oriented to person, place, and time. No cranial nerve deficit.  Skin: Skin is warm and dry. No rash noted.  Psychiatric: He has a normal mood and affect. His behavior is normal.  Nursing note and vitals reviewed. BP 100/64 mmHg  Pulse 60  Temp(Src) 98.7 F (37.1 C)  (Oral)  Resp 16  Ht 6\' 1"  (1.854 m)  Wt 190 lb (86.183 kg)  BMI 25.07 kg/m2  SpO2 98% Xray=no fx        Assessment & Plan:  Pain, hand joint, right - Plan: DG Hand Complete Right  Contusion, hand, right, subsequent encounter  Hand warmup hot water Pad with moleskin diclof gel bid option for 3-4 weeks  I have completed the patient encounter in its entirety as documented by the scribe, with editing by me where necessary. Dmiya Malphrus P. Laney Pastor, M.D.

## 2015-05-23 NOTE — Patient Instructions (Signed)
Because you received an x-ray today, you will receive an invoice from [facility]. Please contact [facility] at [phone] with questions or concerns regarding your invoice. Our billing staff will not be able to assist you with those questions. °

## 2018-01-11 IMAGING — CR DG HAND COMPLETE 3+V*R*
3 series · 3 of 3 positions shown · non-contrast
Comparison: None.

CLINICAL DATA: Injury. Hand pain.   Swelling.

EXAM:
RIGHT HAND - COMPLETE 3+ VIEW

[PA]
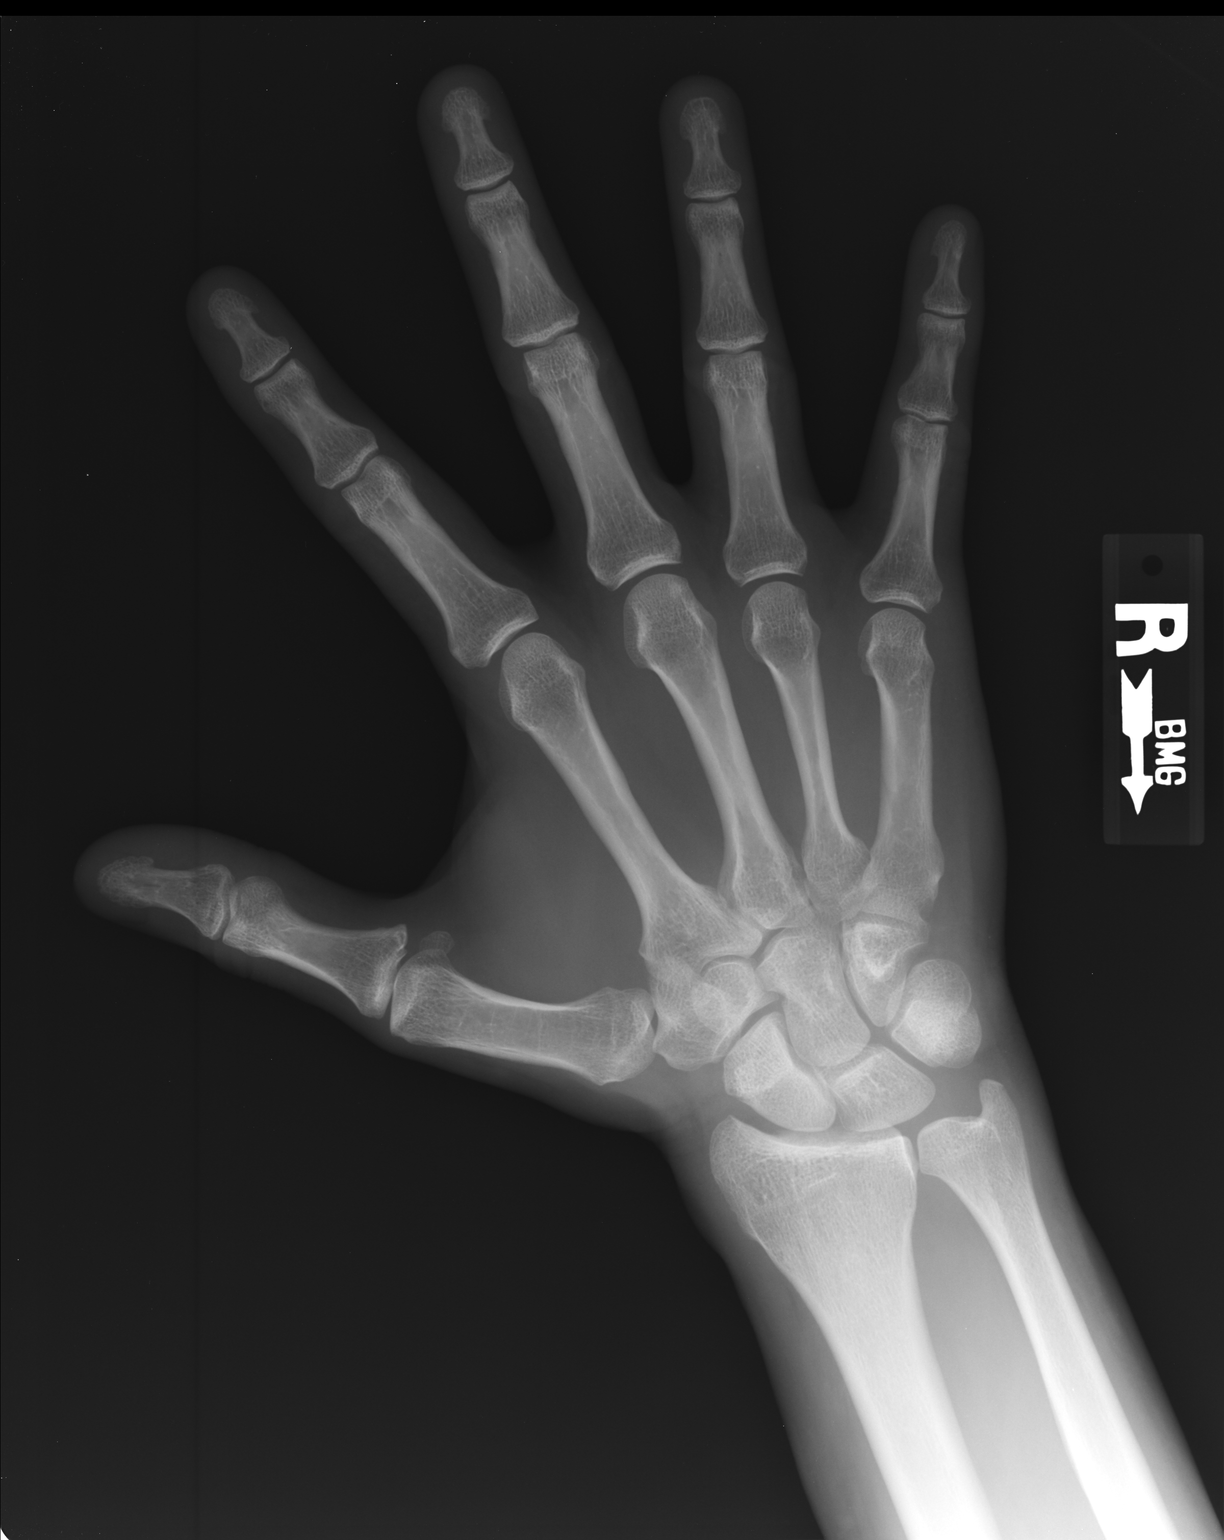

[pa obl]
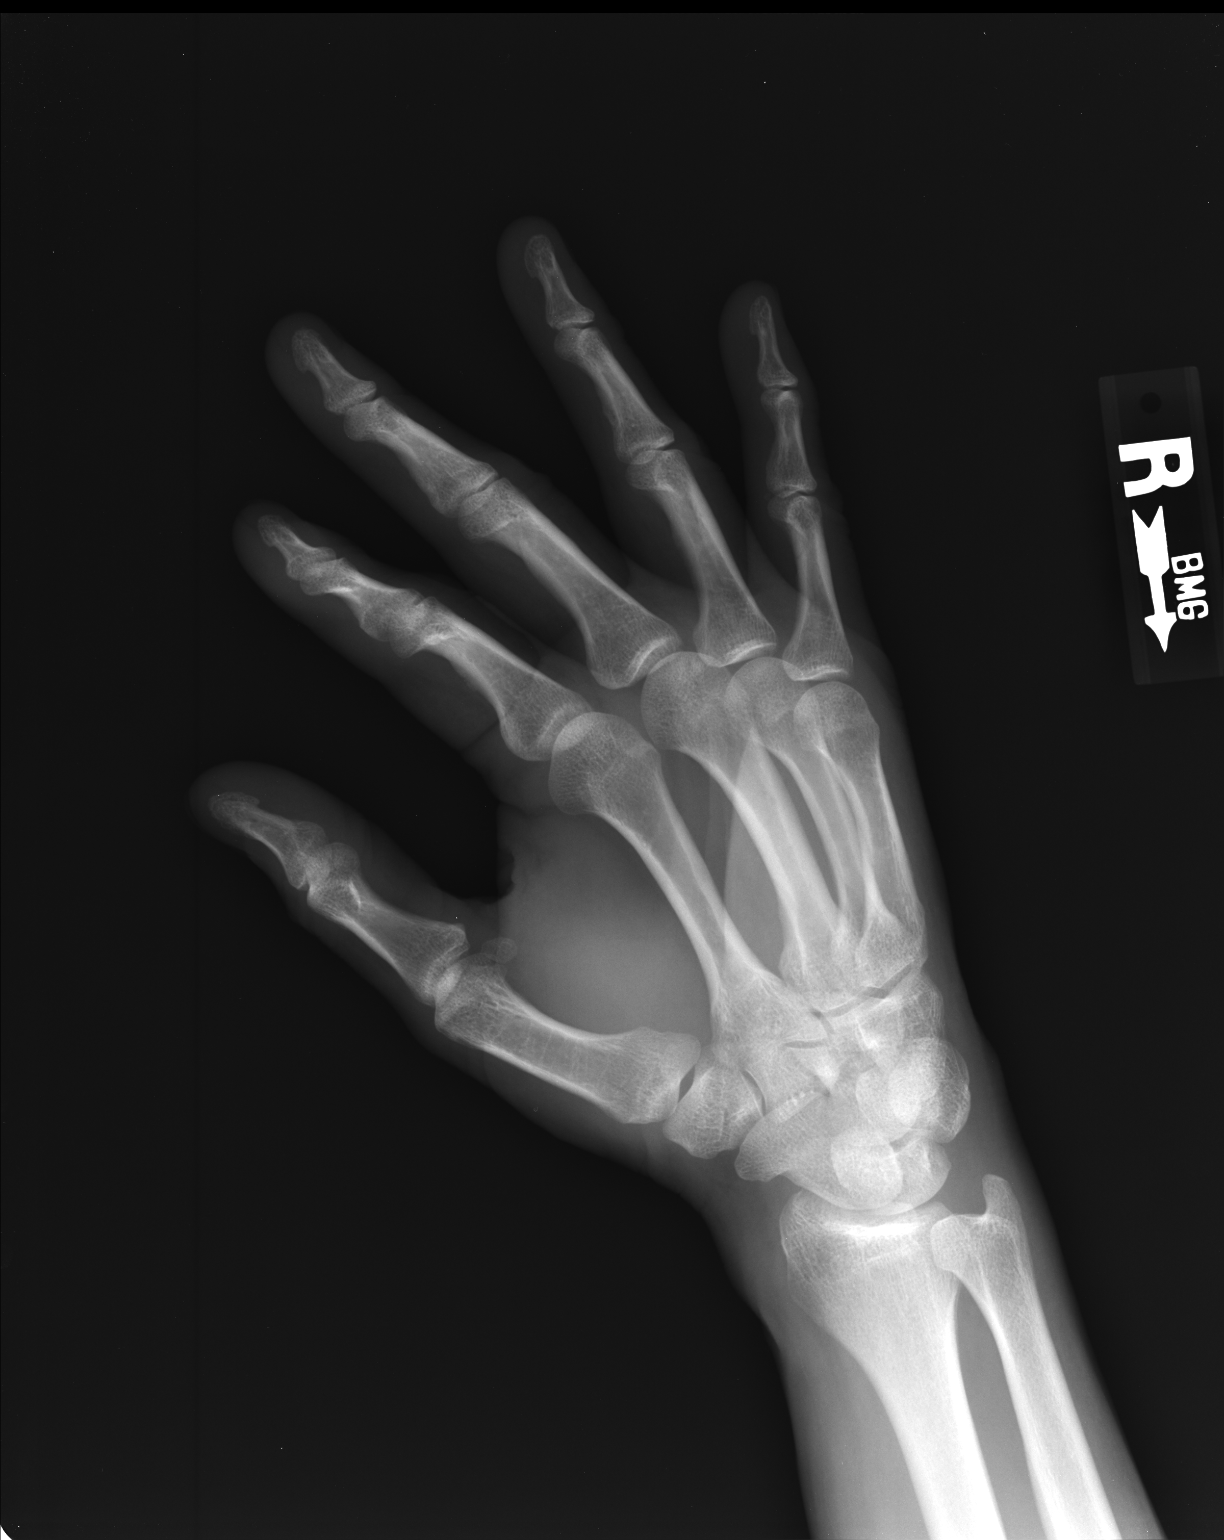

[lateral]
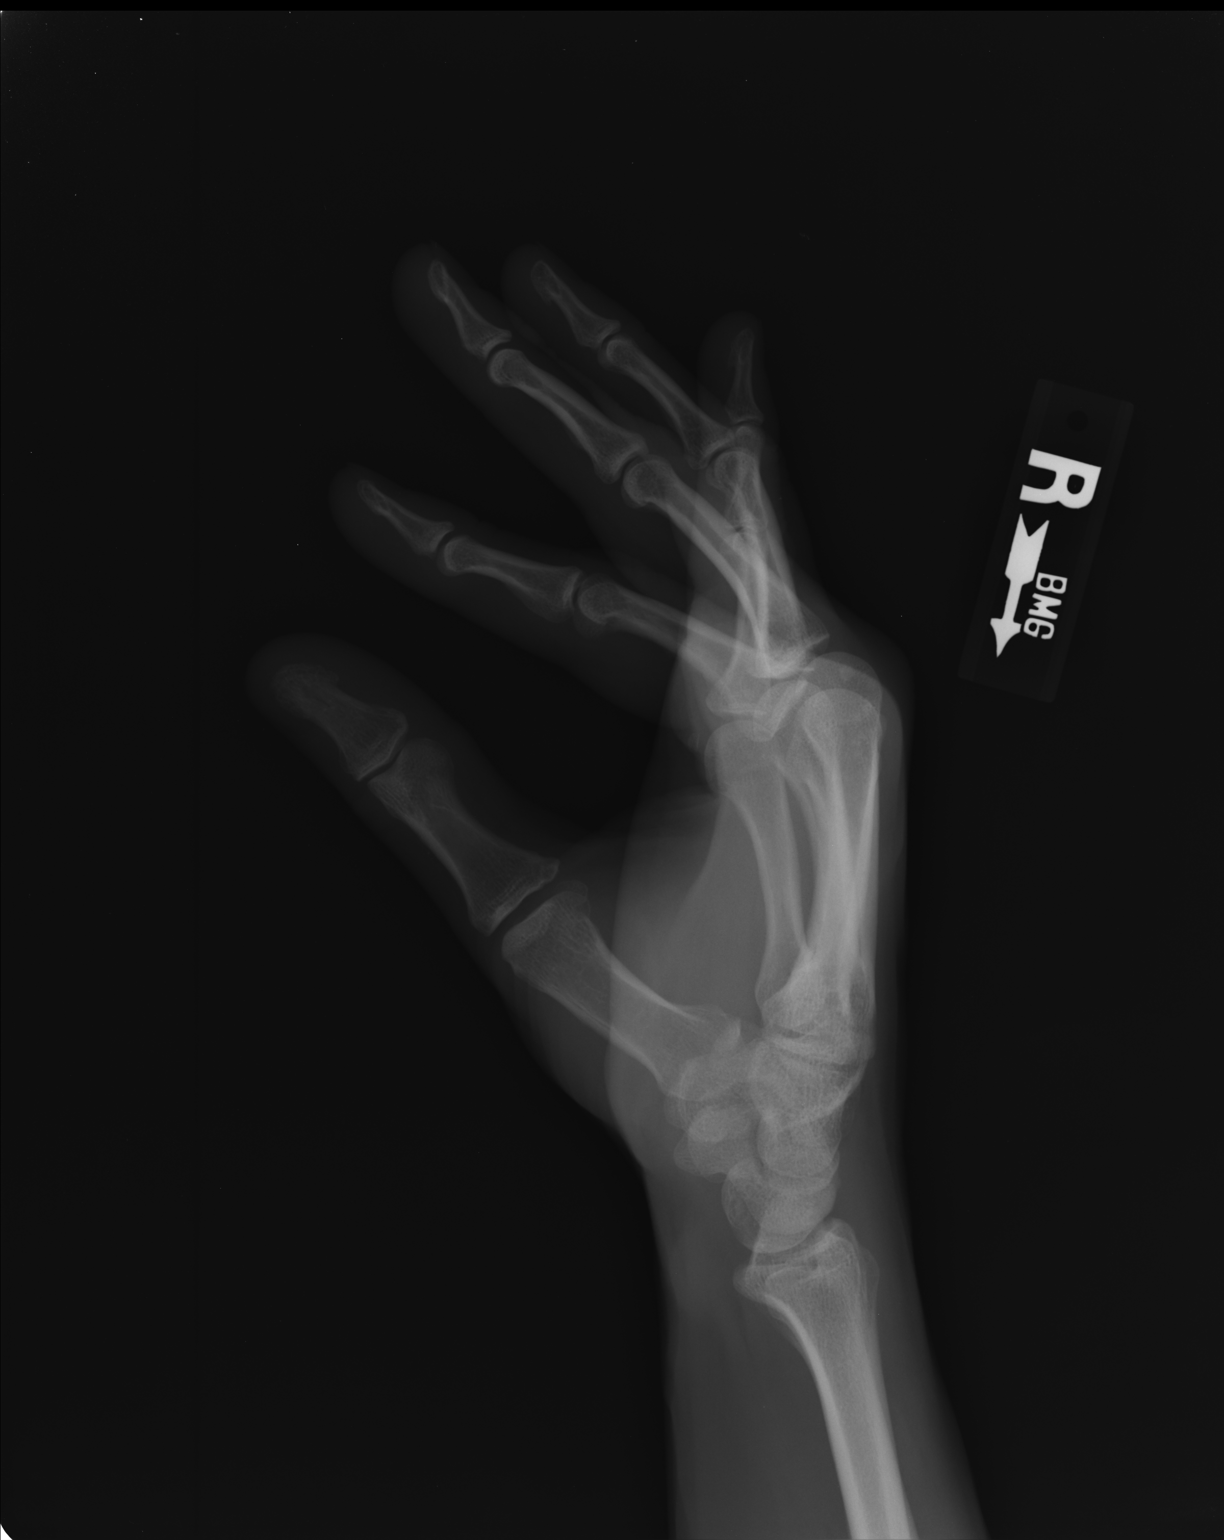

[3 of 3 positions shown; findings below may reference images not displayed]

FINDINGS: There is no evidence of fracture or dislocation. There is no
evidence of arthropathy or other focal bone abnormality. Soft
tissues are unremarkable.
IMPRESSION: No acute bony or joint abnormality.

## 2023-05-23 ENCOUNTER — Ambulatory Visit: Payer: Managed Care, Other (non HMO) | Admitting: Physician Assistant

## 2023-05-23 ENCOUNTER — Encounter: Payer: Self-pay | Admitting: Physician Assistant

## 2023-05-23 VITALS — BP 100/67 | HR 66 | Temp 97.8°F | Ht 73.0 in | Wt 203.0 lb

## 2023-05-23 DIAGNOSIS — Z833 Family history of diabetes mellitus: Secondary | ICD-10-CM | POA: Diagnosis not present

## 2023-05-23 DIAGNOSIS — Z1211 Encounter for screening for malignant neoplasm of colon: Secondary | ICD-10-CM

## 2023-05-23 DIAGNOSIS — R519 Headache, unspecified: Secondary | ICD-10-CM | POA: Diagnosis not present

## 2023-05-23 DIAGNOSIS — M542 Cervicalgia: Secondary | ICD-10-CM | POA: Diagnosis not present

## 2023-05-23 DIAGNOSIS — G8929 Other chronic pain: Secondary | ICD-10-CM

## 2023-05-23 DIAGNOSIS — Z0000 Encounter for general adult medical examination without abnormal findings: Secondary | ICD-10-CM | POA: Diagnosis not present

## 2023-05-23 DIAGNOSIS — Z13220 Encounter for screening for lipoid disorders: Secondary | ICD-10-CM | POA: Diagnosis not present

## 2023-05-23 DIAGNOSIS — Z125 Encounter for screening for malignant neoplasm of prostate: Secondary | ICD-10-CM

## 2023-05-23 DIAGNOSIS — Z85831 Personal history of malignant neoplasm of soft tissue: Secondary | ICD-10-CM | POA: Diagnosis not present

## 2023-05-23 LAB — CBC WITH DIFFERENTIAL/PLATELET
Basophils Absolute: 0 10*3/uL (ref 0.0–0.1)
Basophils Relative: 0.5 % (ref 0.0–3.0)
Eosinophils Absolute: 0.2 10*3/uL (ref 0.0–0.7)
Eosinophils Relative: 2.3 % (ref 0.0–5.0)
HCT: 51.3 % (ref 39.0–52.0)
Hemoglobin: 17.5 g/dL — ABNORMAL HIGH (ref 13.0–17.0)
Lymphocytes Relative: 23.8 % (ref 12.0–46.0)
Lymphs Abs: 2 10*3/uL (ref 0.7–4.0)
MCHC: 34.2 g/dL (ref 30.0–36.0)
MCV: 86.5 fL (ref 78.0–100.0)
Monocytes Absolute: 0.7 10*3/uL (ref 0.1–1.0)
Monocytes Relative: 8.5 % (ref 3.0–12.0)
Neutro Abs: 5.4 10*3/uL (ref 1.4–7.7)
Neutrophils Relative %: 64.9 % (ref 43.0–77.0)
Platelets: 219 10*3/uL (ref 150.0–400.0)
RBC: 5.93 Mil/uL — ABNORMAL HIGH (ref 4.22–5.81)
RDW: 13.5 % (ref 11.5–15.5)
WBC: 8.4 10*3/uL (ref 4.0–10.5)

## 2023-05-23 LAB — LIPID PANEL
Cholesterol: 173 mg/dL (ref 0–200)
HDL: 32.6 mg/dL — ABNORMAL LOW (ref >39.00)
LDL Cholesterol: 108 mg/dL — ABNORMAL HIGH (ref 0–99)
NonHDL: 140.36
Total CHOL/HDL Ratio: 5
Triglycerides: 160 mg/dL — ABNORMAL HIGH (ref 0.0–149.0)
VLDL: 32 mg/dL (ref 0.0–40.0)

## 2023-05-23 LAB — COMPREHENSIVE METABOLIC PANEL
ALT: 20 U/L (ref 0–53)
AST: 37 U/L (ref 0–37)
Albumin: 4.7 g/dL (ref 3.5–5.2)
Alkaline Phosphatase: 65 U/L (ref 39–117)
BUN: 18 mg/dL (ref 6–23)
CO2: 28 meq/L (ref 19–32)
Calcium: 9.5 mg/dL (ref 8.4–10.5)
Chloride: 102 meq/L (ref 96–112)
Creatinine, Ser: 1.09 mg/dL (ref 0.40–1.50)
GFR: 77.52 mL/min (ref >60.00)
Glucose, Bld: 136 mg/dL — ABNORMAL HIGH (ref 70–99)
Potassium: 3.9 meq/L (ref 3.5–5.1)
Sodium: 139 meq/L (ref 135–145)
Total Bilirubin: 0.8 mg/dL (ref 0.2–1.2)
Total Protein: 7.2 g/dL (ref 6.0–8.3)

## 2023-05-23 LAB — HEMOGLOBIN A1C: Hgb A1c MFr Bld: 5.8 % (ref 4.6–6.5)

## 2023-05-23 LAB — PSA: PSA: 1.39 ng/mL (ref 0.10–4.00)

## 2023-05-23 MED ORDER — CYCLOBENZAPRINE HCL 5 MG PO TABS: 5.0000 mg | ORAL_TABLET | Freq: Every day | ORAL | 0 refills | Status: AC

## 2023-05-23 NOTE — Progress Notes (Unsigned)
New patient visit   Patient: Gene Nguyen   DOB: June 13, 1970   53 y.o. Male  MRN: [id number] Visit Date: 05/23/2023  Today's healthcare provider: Alfredia Ferguson, PA-C   Chief Complaint  Patient presents with   Establish Care    Patient would like CPE if possible-  Pressure in neck & head- mainly top of head and nape of neck. Not painful.   States no PCP before coming here.   Subjective    Gene Nguyen is a 53 y.o. 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   Discussed the use of AI scribe software for clinical note transcription with the patient, who gave verbal consent to proceed.  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 The patient describes the sensation as similar to a headache but constant, and wonders if it could be related to his work schedule or sleeping patterns. He feels it from the base of his neck to the top of his head. Denies vision changes, numbness, paresthesias.   The patient also mentions a peculiar sensation that occurred last year when a dog licked him, describing it as a sensation running through his body, similar to a drug running through the bloodstream in movies. The patient denies any recurrence of this sensation.  In addition to the head and neck pressure, the patient reports working with chemical powders at his job, which often results in the powder drying on his skin before he can shower at home. The patient expresses concern about the potential effects of these chemicals on his health.   The patient also mentions a history of a tumor removed from his shoulder in 2015, followed by radiation therapy but no subsequent surgery or chemotherapy. The patient has not  followed up with oncology since 2016.   Past Medical History:  Diagnosis Date   Sarcoma [facility])    History reviewed. No pertinent surgical history. Family Status  Relation Name Status   Mother  Alive   Father  Alive   Sister  Deceased   Brother  Alive   Son 2 Alive   Youth worker  (Not Specified)   Other family hx Other  No partnership data on file   Family History  Problem Relation Age of Onset   Diabetes Mother    Breast cancer Mother    Prostate cancer Father    Breast cancer Sister        deceased at 22   Heart attack Sister    Breast cancer Maternal Aunt    Cancer Other    Social History   Socioeconomic History   Marital status: Married    Spouse name: Not on file   Number of children: Not on file   Years of education: Not on file   Highest education level: Not on file  Occupational History   Not on file  Tobacco Use   Smoking status: Never   Smokeless tobacco: Not on file  Substance and Sexual Activity   Alcohol use: Yes    Comment: rare wine   Drug use: No   Sexual activity: Not on file  Other Topics Concern   Not on file  Social History Narrative   Not on file   Social Drivers  of Health   Financial Resource Strain: Not on file  Food Insecurity: Not on file  Transportation Needs: Not on file  Physical Activity: Not on file  Stress: Not on file  Social Connections: Unknown (11/15/2021)   Received [facility]   Social Network    Social Network: Not on file   Outpatient Medications Prior to Visit  Medication Sig   [DISCONTINUED] acetaminophen (TYLENOL) 650 MG CR tablet Take by mouth. Reported on 04/21/2015   [DISCONTINUED] Diclofenac Sodium 3 % GEL Place 1 application onto the skin 2 (two) times daily.   [DISCONTINUED] ibuprofen (ADVIL,MOTRIN) 800 MG tablet Take by mouth. Reported on 04/21/2015   [DISCONTINUED] OVER THE COUNTER MEDICATION Take 1 capsule by mouth daily. Reported on 05/23/2015   No facility-administered medications prior to  visit.   Allergies  Allergen Reactions   Hydrocodone Nausea Only   Latex Dermatitis     There is no immunization history on file for this patient.  Health Maintenance  Topic Date Due   COVID-19 Vaccine (1) Never done   HIV Screening  Never done   Hepatitis C Screening  Never done   DTaP/Tdap/Td (1 - Tdap) Never done   Zoster Vaccines- Shingrix (1 of 2) Never done   Colonoscopy  Never done   INFLUENZA VACCINE  07/03/2023 (Originally 11/03/2022)   HPV VACCINES  Aged Out    Patient Care Team: Alfredia Ferguson, PA-C as PCP - General (Physician Assistant)  Review of Systems  Genitourinary:  Positive for frequency.        Objective    BP 100/67   Pulse 66   Temp 97.8 F (36.6 C) (Oral)   Ht 6\' 1"  (1.854 m)   Wt 203 lb (92.1 kg)   SpO2 99%   BMI 26.78 kg/m     Physical Exam Constitutional:      General: He is awake.     Appearance: He is well-developed.  HENT:     Head: Normocephalic.     Right Ear: Tympanic membrane, ear canal and external ear normal.     Left Ear: Tympanic membrane, ear canal and external ear normal.     Nose: Nose normal. No congestion or rhinorrhea.     Mouth/Throat:     Mouth: Mucous membranes are moist.     Pharynx: No oropharyngeal exudate or posterior oropharyngeal erythema.  Eyes:     Pupils: Pupils are equal, round, and reactive to light.  Cardiovascular:     Rate and Rhythm: Normal rate and regular rhythm.     Heart sounds: Normal heart sounds.  Pulmonary:     Effort: Pulmonary effort is normal.     Breath sounds: Normal breath sounds.  Abdominal:     General: There is no distension.     Palpations: Abdomen is soft.     Tenderness: There is no abdominal tenderness. There is no guarding.  Musculoskeletal:     Cervical back: Normal range of motion.     Right lower leg: No edema.     Left lower leg: No edema.  Lymphadenopathy:     Cervical: No cervical adenopathy.  Skin:    General: Skin is warm.  Neurological:     Mental  Status: He is alert and oriented to person, place, and time.  Psychiatric:        Attention and Perception: Attention normal.        Mood and Affect: Mood normal.        Speech: Speech normal.  Behavior: Behavior normal. Behavior is cooperative.     Depression Screen    05/23/2023    9:17 AM 05/23/2015   10:40 AM 04/21/2015    8:02 PM  PHQ 2/9 Scores  PHQ - 2 Score 0 0 0   Results for orders placed or performed in visit on 05/23/23  CBC w/Diff  Result Value Ref Range   WBC 8.4 4.0 - 10.5 K/uL   RBC 5.93 (H) 4.22 - 5.81 Mil/uL   Hemoglobin 17.5 (H) 13.0 - 17.0 g/dL   HCT 78.2 95.6 - 21.3 %   MCV 86.5 78.0 - 100.0 fl   MCHC 34.2 30.0 - 36.0 g/dL   RDW 08.6 57.8 - 46.9 %   Platelets 219.0 150.0 - 400.0 K/uL   Neutrophils Relative % 64.9 43.0 - 77.0 %   Lymphocytes Relative 23.8 12.0 - 46.0 %   Monocytes Relative 8.5 3.0 - 12.0 %   Eosinophils Relative 2.3 0.0 - 5.0 %   Basophils Relative 0.5 0.0 - 3.0 %   Neutro Abs 5.4 1.4 - 7.7 K/uL   Lymphs Abs 2.0 0.7 - 4.0 K/uL   Monocytes Absolute 0.7 0.1 - 1.0 K/uL   Eosinophils Absolute 0.2 0.0 - 0.7 K/uL   Basophils Absolute 0.0 0.0 - 0.1 K/uL  Comp Met (CMET)  Result Value Ref Range   Sodium 139 135 - 145 mEq/L   Potassium 3.9 3.5 - 5.1 mEq/L   Chloride 102 96 - 112 mEq/L   CO2 28 19 - 32 mEq/L   Glucose, Bld 136 (H) 70 - 99 mg/dL   BUN 18 6 - 23 mg/dL   Creatinine, Ser 6.29 0.40 - 1.50 mg/dL   Total Bilirubin 0.8 0.2 - 1.2 mg/dL   Alkaline Phosphatase 65 39 - 117 U/L   AST 37 0 - 37 U/L   ALT 20 0 - 53 U/L   Total Protein 7.2 6.0 - 8.3 g/dL   Albumin 4.7 3.5 - 5.2 g/dL   GFR 52.84 >13.24 mL/min   Calcium 9.5 8.4 - 10.5 mg/dL  Lipid panel  Result Value Ref Range   Cholesterol 173 0 - 200 mg/dL   Triglycerides 401.0 (H) 0.0 - 149.0 mg/dL   HDL 27.25 (L) >36.64 mg/dL   VLDL 40.3 0.0 - 47.4 mg/dL   LDL Cholesterol 259 (H) 0 - 99 mg/dL   Total CHOL/HDL Ratio 5    NonHDL 140.36   PSA  Result Value Ref Range    PSA 1.39 0.10 - 4.00 ng/mL  HgB A1c  Result Value Ref Range   Hgb A1c MFr Bld 5.8 4.6 - 6.5 %    Assessment & Plan     Annual physical exam General recommendations: --balanced diet high in fiber and protein, low in sugars, carbs, fats. --physical activity/exercise 20-30 minutes 3-5 times a week   -     CBC with Differential/Platelet -     Comprehensive metabolic panel -     Lipid panel  Chronic intractable headache, unspecified headache type, Neck pain -     MR BRAIN WO CONTRAST; Future Assessment & Plan: Chronic head and neck pressure for several months, described as constant pressure rather than severe headache. No vision changes, significant weakness, or tremors. Differential includes tension-type headache or muscle strain due to occupational posture. Given history of shoulder tumor and radiation therapy, MRI of head and neck warranted to rule out serious conditions. Discussed MRI risks and benefits, including potential to identify serious conditions versus low likelihood  of metastatic disease. Muscle relaxants may help if muscular, with drowsiness as primary side effect. - Order MRI of head and neck - Prescribe low-dose muscle relaxant for trial use at night  Orders: -     MR CERVICAL SPINE WO CONTRAST; Future -     Cyclobenzaprine HCl; Take 1 tablet (5 mg total) by mouth at bedtime.  Dispense: 30 tablet; Refill: 0  History of sarcoma Assessment & Plan: Shoulder tumor removed in 2015 with subsequent radiation therapy. On review of oncology notes-- it was recommended for him to have a local dissection surgery given initial positive margins. Per pt he did not do this.    Family history of diabetes mellitus -     Hemoglobin A1c  Colon cancer screening -     Cologuard  Prostate cancer screening -     PSA   General Health Maintenance No flu, shingles, or tetanus vaccines. Pt declines all vaccines today. No colon cancer screening. Reviewed cologuard and colonoscopy. Pt is  unsure, will send in at least cologuard. Family history of type 2 diabetes, breast cancer, and prostate issues.    Return if symptoms worsen or fail to improve.      Alfredia Ferguson, PA-C  [facility] [phone] (phone) [phone] (fax)  [facility]

## 2023-05-24 ENCOUNTER — Encounter: Payer: Self-pay | Admitting: Physician Assistant

## 2023-05-24 DIAGNOSIS — R519 Headache, unspecified: Secondary | ICD-10-CM | POA: Insufficient documentation

## 2023-05-24 DIAGNOSIS — Z85831 Personal history of malignant neoplasm of soft tissue: Secondary | ICD-10-CM | POA: Insufficient documentation

## 2023-05-24 DIAGNOSIS — M542 Cervicalgia: Secondary | ICD-10-CM | POA: Insufficient documentation

## 2023-05-24 NOTE — Assessment & Plan Note (Signed)
Chronic head and neck pressure for several months, described as constant pressure rather than severe headache. No vision changes, significant weakness, or tremors. Differential includes tension-type headache or muscle strain due to occupational posture. Given history of shoulder tumor and radiation therapy, MRI of head and neck warranted to rule out serious conditions. Discussed MRI risks and benefits, including potential to identify serious conditions versus low likelihood of metastatic disease. Muscle relaxants may help if muscular, with drowsiness as primary side effect. - Order MRI of head and neck - Prescribe low-dose muscle relaxant for trial use at night

## 2023-05-24 NOTE — Assessment & Plan Note (Signed)
Shoulder tumor removed in 2015 with subsequent radiation therapy. On review of oncology notes-- it was recommended for him to have a local dissection surgery given initial positive margins. Per pt he did not do this.

## 2023-05-25 NOTE — Addendum Note (Signed)
Addended byAlfredia Ferguson on: 05/25/2023 11:39 AM   Modules accepted: Orders

## 2023-07-10 LAB — COLOGUARD: COLOGUARD: NEGATIVE
